# Patient Record
Sex: Male | Born: 2000 | Race: White | Hispanic: No | Marital: Single | Smoking: Never smoker
Health system: Southern US, Community
[De-identification: ages and names within clinical notes are randomized; demographics above are authoritative.]

---
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
## 2020-10-29 ENCOUNTER — Emergency Department (HOSPITAL_BASED_OUTPATIENT_CLINIC_OR_DEPARTMENT_OTHER): Payer: Managed Care, Other (non HMO)

## 2020-10-29 ENCOUNTER — Emergency Department (HOSPITAL_BASED_OUTPATIENT_CLINIC_OR_DEPARTMENT_OTHER)
Admission: EM | Admit: 2020-10-29 | Discharge: 2020-10-29 | Disposition: A | Discharge status: Home / Self Care | Payer: Managed Care, Other (non HMO) | Attending: Emergency Medicine | Admitting: Emergency Medicine

## 2020-10-29 ENCOUNTER — Other Ambulatory Visit: Payer: Self-pay

## 2020-10-29 ENCOUNTER — Encounter (HOSPITAL_BASED_OUTPATIENT_CLINIC_OR_DEPARTMENT_OTHER): Payer: Self-pay | Admitting: *Deleted

## 2020-10-29 DIAGNOSIS — E039 Hypothyroidism, unspecified: Secondary | ICD-10-CM | POA: Insufficient documentation

## 2020-10-29 DIAGNOSIS — R059 Cough, unspecified: Secondary | ICD-10-CM

## 2020-10-29 DIAGNOSIS — S27321D Contusion of lung, unilateral, subsequent encounter: Secondary | ICD-10-CM | POA: Insufficient documentation

## 2020-10-29 DIAGNOSIS — R1032 Left lower quadrant pain: Secondary | ICD-10-CM | POA: Insufficient documentation

## 2020-10-29 DIAGNOSIS — S27321A Contusion of lung, unilateral, initial encounter: Secondary | ICD-10-CM

## 2020-10-29 DIAGNOSIS — S299XXA Unspecified injury of thorax, initial encounter: Secondary | ICD-10-CM | POA: Diagnosis present

## 2020-10-29 DIAGNOSIS — T1490XA Injury, unspecified, initial encounter: Secondary | ICD-10-CM

## 2020-10-29 LAB — COMPREHENSIVE METABOLIC PANEL
ALT: 23 U/L (ref 0–44)
AST: 24 U/L (ref 15–41)
Albumin: 4.9 g/dL (ref 3.5–5.0)
Alkaline Phosphatase: 62 U/L (ref 38–126)
Anion gap: 9 (ref 5–15)
BUN: 11 mg/dL (ref 6–20)
CO2: 26 mmol/L (ref 22–32)
Calcium: 9.6 mg/dL (ref 8.9–10.3)
Chloride: 102 mmol/L (ref 98–111)
Creatinine, Ser: 0.97 mg/dL (ref 0.61–1.24)
GFR, Estimated: >60 mL/min (ref >60)
Glucose, Bld: 93 mg/dL (ref 70–99)
Potassium: 4.1 mmol/L (ref 3.5–5.1)
Sodium: 137 mmol/L (ref 135–145)
Total Bilirubin: 0.7 mg/dL (ref 0.3–1.2)
Total Protein: 7.8 g/dL (ref 6.5–8.1)

## 2020-10-29 LAB — CBC WITH DIFFERENTIAL/PLATELET
Abs Immature Granulocytes: 0.01 10*3/uL (ref 0.00–0.07)
Basophils Absolute: 0 10*3/uL (ref 0.0–0.1)
Basophils Relative: 1 %
Eosinophils Absolute: 0.3 10*3/uL (ref 0.0–0.5)
Eosinophils Relative: 5 %
HCT: 46.2 % (ref 39.0–52.0)
Hemoglobin: 15.7 g/dL (ref 13.0–17.0)
Immature Granulocytes: 0 %
Lymphocytes Relative: 28 %
Lymphs Abs: 1.6 10*3/uL (ref 0.7–4.0)
MCH: 27.8 pg (ref 26.0–34.0)
MCHC: 34 g/dL (ref 30.0–36.0)
MCV: 81.8 fL (ref 80.0–100.0)
Monocytes Absolute: 0.5 10*3/uL (ref 0.1–1.0)
Monocytes Relative: 8 %
Neutro Abs: 3.2 10*3/uL (ref 1.7–7.7)
Neutrophils Relative %: 58 %
Platelets: 198 10*3/uL (ref 150–400)
RBC: 5.65 MIL/uL (ref 4.22–5.81)
RDW: 12.5 % (ref 11.5–15.5)
WBC: 5.6 10*3/uL (ref 4.0–10.5)
nRBC: 0 % (ref 0.0–0.2)

## 2020-10-29 MED ORDER — IOHEXOL 300 MG/ML  SOLN
100.0000 mL | Freq: Once | INTRAMUSCULAR | Status: AC | PRN
  Administered 2020-10-29: 100 mL via INTRAVENOUS

## 2020-10-29 MED ORDER — HYDROCODONE-HOMATROPINE 5-1.5 MG/5ML PO SYRP: 5.0000 mL | ORAL_SOLUTION | Freq: Four times a day (QID) | ORAL | 0 refills | Status: AC | PRN
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
## 2020-10-29 NOTE — ED Notes (Signed)
Taken to CT at this time. 
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
## 2020-10-29 NOTE — ED Notes (Signed)
Ambulated in hall while checking O2 sat.  Noted to be 99-100% throughout.  Tolerated well.  Provider made aware.

## 2020-10-29 NOTE — ED Notes (Signed)
Educated on use of IS and chest splinting with cough and sneezing.  Verbalized understanding.
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
